# Patient Record
Sex: Female | Born: 1955 | Race: White | Hispanic: No | Smoking: Current every day smoker
Health system: Southern US, Community
[De-identification: ages and names within clinical notes are randomized; demographics above are authoritative.]

## PROBLEM LIST (undated history)

## (undated) DIAGNOSIS — B1920 Unspecified viral hepatitis C without hepatic coma: Secondary | ICD-10-CM

## (undated) DIAGNOSIS — G43909 Migraine, unspecified, not intractable, without status migrainosus: Secondary | ICD-10-CM

## (undated) DIAGNOSIS — I10 Essential (primary) hypertension: Secondary | ICD-10-CM

---

## 1999-09-25 ENCOUNTER — Emergency Department (HOSPITAL_COMMUNITY): Admission: EM | Admit: 1999-09-25 | Discharge: 1999-09-25 | Payer: Self-pay | Admitting: Emergency Medicine

## 1999-12-16 ENCOUNTER — Encounter: Payer: Self-pay | Admitting: Family Medicine

## 1999-12-16 ENCOUNTER — Ambulatory Visit (HOSPITAL_COMMUNITY): Admission: RE | Admit: 1999-12-16 | Discharge: 1999-12-16 | Payer: Self-pay | Admitting: Family Medicine

## 1999-12-20 ENCOUNTER — Ambulatory Visit (HOSPITAL_COMMUNITY): Admission: RE | Admit: 1999-12-20 | Discharge: 1999-12-20 | Payer: Self-pay | Admitting: Family Medicine

## 1999-12-20 ENCOUNTER — Encounter: Payer: Self-pay | Admitting: Family Medicine

## 2000-11-14 ENCOUNTER — Emergency Department (HOSPITAL_COMMUNITY): Admission: EM | Admit: 2000-11-14 | Discharge: 2000-11-14 | Payer: Self-pay | Admitting: Emergency Medicine

## 2000-11-23 ENCOUNTER — Encounter: Payer: Self-pay | Admitting: Emergency Medicine

## 2000-11-23 ENCOUNTER — Emergency Department (HOSPITAL_COMMUNITY): Admission: EM | Admit: 2000-11-23 | Discharge: 2000-11-23 | Payer: Self-pay | Admitting: Emergency Medicine

## 2001-02-13 ENCOUNTER — Encounter: Payer: Self-pay | Admitting: *Deleted

## 2001-02-13 ENCOUNTER — Ambulatory Visit (HOSPITAL_COMMUNITY): Admission: RE | Admit: 2001-02-13 | Discharge: 2001-02-13 | Payer: Self-pay | Admitting: *Deleted

## 2002-07-08 ENCOUNTER — Emergency Department (HOSPITAL_COMMUNITY): Admission: EM | Admit: 2002-07-08 | Discharge: 2002-07-08 | Payer: Self-pay | Admitting: *Deleted

## 2002-11-18 ENCOUNTER — Emergency Department (HOSPITAL_COMMUNITY): Admission: EM | Admit: 2002-11-18 | Discharge: 2002-11-18 | Payer: Self-pay | Admitting: Emergency Medicine

## 2002-11-18 ENCOUNTER — Encounter: Payer: Self-pay | Admitting: Emergency Medicine

## 2002-12-17 ENCOUNTER — Inpatient Hospital Stay (HOSPITAL_COMMUNITY): Admission: AD | Admit: 2002-12-17 | Discharge: 2002-12-18 | Payer: Self-pay | Admitting: Neurology

## 2002-12-17 ENCOUNTER — Encounter: Payer: Self-pay | Admitting: Emergency Medicine

## 2002-12-17 ENCOUNTER — Encounter: Payer: Self-pay | Admitting: Neurology

## 2002-12-18 ENCOUNTER — Encounter: Payer: Self-pay | Admitting: Cardiovascular Disease

## 2002-12-18 ENCOUNTER — Encounter: Payer: Self-pay | Admitting: Neurology

## 2003-01-23 ENCOUNTER — Ambulatory Visit (HOSPITAL_COMMUNITY): Admission: RE | Admit: 2003-01-23 | Discharge: 2003-01-23 | Payer: Self-pay | Admitting: Family Medicine

## 2003-01-23 ENCOUNTER — Encounter: Payer: Self-pay | Admitting: Family Medicine

## 2003-02-26 ENCOUNTER — Encounter: Payer: Self-pay | Admitting: Emergency Medicine

## 2003-02-26 ENCOUNTER — Emergency Department (HOSPITAL_COMMUNITY): Admission: EM | Admit: 2003-02-26 | Discharge: 2003-02-26 | Payer: Self-pay | Admitting: Emergency Medicine

## 2003-03-14 ENCOUNTER — Encounter: Payer: Self-pay | Admitting: *Deleted

## 2003-03-14 ENCOUNTER — Ambulatory Visit (HOSPITAL_COMMUNITY): Admission: RE | Admit: 2003-03-14 | Discharge: 2003-03-14 | Payer: Self-pay | Admitting: *Deleted

## 2003-03-23 ENCOUNTER — Ambulatory Visit (HOSPITAL_COMMUNITY): Admission: RE | Admit: 2003-03-23 | Discharge: 2003-03-23 | Payer: Self-pay | Admitting: *Deleted

## 2003-03-23 ENCOUNTER — Encounter: Payer: Self-pay | Admitting: *Deleted

## 2003-04-02 ENCOUNTER — Emergency Department (HOSPITAL_COMMUNITY): Admission: EM | Admit: 2003-04-02 | Discharge: 2003-04-02 | Payer: Self-pay | Admitting: Emergency Medicine

## 2003-04-22 ENCOUNTER — Ambulatory Visit (HOSPITAL_COMMUNITY): Admission: RE | Admit: 2003-04-22 | Discharge: 2003-04-22 | Payer: Self-pay | Admitting: Internal Medicine

## 2003-04-22 ENCOUNTER — Encounter: Payer: Self-pay | Admitting: Internal Medicine

## 2003-04-22 ENCOUNTER — Encounter (INDEPENDENT_AMBULATORY_CARE_PROVIDER_SITE_OTHER): Payer: Self-pay

## 2003-12-19 ENCOUNTER — Encounter: Admission: RE | Admit: 2003-12-19 | Discharge: 2003-12-19 | Payer: Self-pay | Admitting: Internal Medicine

## 2004-03-11 ENCOUNTER — Emergency Department (HOSPITAL_COMMUNITY): Admission: EM | Admit: 2004-03-11 | Discharge: 2004-03-11 | Payer: Self-pay | Admitting: Family Medicine

## 2004-03-16 ENCOUNTER — Encounter: Admission: RE | Admit: 2004-03-16 | Discharge: 2004-03-16 | Payer: Self-pay | Admitting: Internal Medicine

## 2004-03-23 ENCOUNTER — Encounter: Admission: RE | Admit: 2004-03-23 | Discharge: 2004-03-23 | Payer: Self-pay | Admitting: Internal Medicine

## 2004-03-24 ENCOUNTER — Encounter: Admission: RE | Admit: 2004-03-24 | Discharge: 2004-03-24 | Payer: Self-pay | Admitting: Internal Medicine

## 2005-07-27 ENCOUNTER — Ambulatory Visit: Payer: Self-pay | Admitting: Internal Medicine

## 2005-11-29 ENCOUNTER — Ambulatory Visit: Payer: Self-pay | Admitting: Internal Medicine

## 2005-12-05 ENCOUNTER — Emergency Department (HOSPITAL_COMMUNITY): Admission: EM | Admit: 2005-12-05 | Discharge: 2005-12-05 | Payer: Self-pay | Admitting: Emergency Medicine

## 2005-12-07 ENCOUNTER — Ambulatory Visit: Payer: Self-pay | Admitting: Internal Medicine

## 2005-12-12 ENCOUNTER — Ambulatory Visit: Payer: Self-pay | Admitting: Internal Medicine

## 2006-09-29 ENCOUNTER — Ambulatory Visit: Payer: Self-pay | Admitting: Internal Medicine

## 2006-10-04 ENCOUNTER — Encounter: Admission: RE | Admit: 2006-10-04 | Discharge: 2006-10-04 | Payer: Self-pay | Admitting: Internal Medicine

## 2006-11-08 ENCOUNTER — Encounter (INDEPENDENT_AMBULATORY_CARE_PROVIDER_SITE_OTHER): Payer: Self-pay | Admitting: Internal Medicine

## 2006-11-08 ENCOUNTER — Ambulatory Visit: Payer: Self-pay | Admitting: Internal Medicine

## 2006-11-08 DIAGNOSIS — IMO0002 Reserved for concepts with insufficient information to code with codable children: Secondary | ICD-10-CM

## 2006-11-08 DIAGNOSIS — E785 Hyperlipidemia, unspecified: Secondary | ICD-10-CM | POA: Insufficient documentation

## 2006-11-08 DIAGNOSIS — N63 Unspecified lump in unspecified breast: Secondary | ICD-10-CM | POA: Insufficient documentation

## 2006-11-08 DIAGNOSIS — B360 Pityriasis versicolor: Secondary | ICD-10-CM

## 2006-11-08 DIAGNOSIS — F17200 Nicotine dependence, unspecified, uncomplicated: Secondary | ICD-10-CM

## 2006-11-08 DIAGNOSIS — B1710 Acute hepatitis C without hepatic coma: Secondary | ICD-10-CM

## 2006-11-08 DIAGNOSIS — F1921 Other psychoactive substance dependence, in remission: Secondary | ICD-10-CM | POA: Insufficient documentation

## 2006-11-08 DIAGNOSIS — K219 Gastro-esophageal reflux disease without esophagitis: Secondary | ICD-10-CM | POA: Insufficient documentation

## 2006-11-08 DIAGNOSIS — A048 Other specified bacterial intestinal infections: Secondary | ICD-10-CM | POA: Insufficient documentation

## 2006-11-08 LAB — CONVERTED CEMR LAB
ALT: 13 U/L
AST: 14 U/L
Albumin: 4.3 g/dL
Alkaline Phosphatase: 85 U/L
BUN: 10 mg/dL
CO2: 19 meq/L
Calcium: 9.2 mg/dL
Chloride: 106 meq/L
Creatinine, Ser: 0.86 mg/dL
Glucose, Bld: 85 mg/dL
Hep B Core Total Ab: POSITIVE — AB
Hep B S Ab: NEGATIVE
Hepatitis B Surface Ag: NEGATIVE
Potassium: 4.3 meq/L
Sodium: 140 meq/L
Total Bilirubin: 0.4 mg/dL
Total Protein: 7.2 g/dL

## 2006-12-01 ENCOUNTER — Ambulatory Visit: Payer: Self-pay | Admitting: Internal Medicine

## 2006-12-01 DIAGNOSIS — Z8619 Personal history of other infectious and parasitic diseases: Secondary | ICD-10-CM

## 2007-01-06 ENCOUNTER — Observation Stay (HOSPITAL_COMMUNITY): Admission: EM | Admit: 2007-01-06 | Discharge: 2007-01-07 | Payer: Self-pay | Admitting: Family Medicine

## 2007-01-08 ENCOUNTER — Telehealth (INDEPENDENT_AMBULATORY_CARE_PROVIDER_SITE_OTHER): Payer: Self-pay | Admitting: *Deleted

## 2007-01-09 ENCOUNTER — Encounter (INDEPENDENT_AMBULATORY_CARE_PROVIDER_SITE_OTHER): Payer: Self-pay | Admitting: Internal Medicine

## 2007-01-10 ENCOUNTER — Encounter (INDEPENDENT_AMBULATORY_CARE_PROVIDER_SITE_OTHER): Payer: Self-pay | Admitting: *Deleted

## 2007-01-10 ENCOUNTER — Encounter (INDEPENDENT_AMBULATORY_CARE_PROVIDER_SITE_OTHER): Payer: Self-pay | Admitting: Internal Medicine

## 2008-03-05 ENCOUNTER — Emergency Department (HOSPITAL_COMMUNITY): Admission: EM | Admit: 2008-03-05 | Discharge: 2008-03-05 | Payer: Self-pay | Admitting: Family Medicine

## 2008-06-16 ENCOUNTER — Encounter: Admission: RE | Admit: 2008-06-16 | Discharge: 2008-06-16 | Payer: Self-pay | Admitting: Internal Medicine

## 2009-03-17 ENCOUNTER — Emergency Department (HOSPITAL_COMMUNITY): Admission: EM | Admit: 2009-03-17 | Discharge: 2009-03-17 | Payer: Self-pay | Admitting: Emergency Medicine

## 2009-04-27 ENCOUNTER — Encounter: Admission: RE | Admit: 2009-04-27 | Discharge: 2009-04-27 | Payer: Self-pay | Admitting: Family Medicine

## 2009-05-11 ENCOUNTER — Encounter: Admission: RE | Admit: 2009-05-11 | Discharge: 2009-05-11 | Payer: Self-pay | Admitting: Family Medicine

## 2009-06-17 ENCOUNTER — Ambulatory Visit (HOSPITAL_COMMUNITY): Admission: RE | Admit: 2009-06-17 | Discharge: 2009-06-18 | Payer: Self-pay | Admitting: Orthopaedic Surgery

## 2011-01-07 LAB — DIFFERENTIAL
Lymphocytes Relative: 28 % (ref 12–46)
Lymphs Abs: 1.9 10*3/uL (ref 0.7–4.0)
Neutro Abs: 3.8 10*3/uL (ref 1.7–7.7)
Neutrophils Relative %: 57 % (ref 43–77)

## 2011-01-07 LAB — COMPREHENSIVE METABOLIC PANEL
AST: 23 U/L (ref 0–37)
Alkaline Phosphatase: 70 U/L (ref 39–117)
BUN: 10 mg/dL (ref 6–23)
GFR calc Af Amer: >60 mL/min (ref >60)
Glucose, Bld: 85 mg/dL (ref 70–99)
Sodium: 139 mEq/L (ref 135–145)

## 2011-01-07 LAB — CBC
HCT: 45.9 % (ref 36.0–46.0)
Hemoglobin: 15.8 g/dL — ABNORMAL HIGH (ref 12.0–15.0)
MCHC: 34.4 g/dL (ref 30.0–36.0)
Platelets: 270 10*3/uL (ref 150–400)
RBC: 4.92 MIL/uL (ref 3.87–5.11)
RDW: 14.3 % (ref 11.5–15.5)

## 2011-01-07 LAB — BILIRUBIN, DIRECT: Bilirubin, Direct: 0.1 mg/dL (ref 0.0–0.3)

## 2011-01-07 LAB — APTT: aPTT: 24 seconds (ref 24–37)

## 2011-01-07 LAB — PROTIME-INR: Prothrombin Time: 12.6 seconds (ref 11.6–15.2)

## 2011-01-10 LAB — URINALYSIS, ROUTINE W REFLEX MICROSCOPIC
Nitrite: NEGATIVE
Specific Gravity, Urine: 1.014 (ref 1.005–1.030)
pH: 5.5 (ref 5.0–8.0)

## 2011-01-10 LAB — DIFFERENTIAL
Basophils Absolute: 0 10*3/uL (ref 0.0–0.1)
Eosinophils Absolute: 0 10*3/uL (ref 0.0–0.7)
Lymphocytes Relative: 16 % (ref 12–46)
Monocytes Absolute: 0.9 10*3/uL (ref 0.1–1.0)
Monocytes Relative: 9 % (ref 3–12)
Neutro Abs: 7.6 10*3/uL (ref 1.7–7.7)
Neutrophils Relative %: 75 % (ref 43–77)

## 2011-01-10 LAB — COMPREHENSIVE METABOLIC PANEL
AST: 23 U/L (ref 0–37)
Alkaline Phosphatase: 71 U/L (ref 39–117)
BUN: 14 mg/dL (ref 6–23)
Chloride: 106 mEq/L (ref 96–112)
Glucose, Bld: 150 mg/dL — ABNORMAL HIGH (ref 70–99)
Potassium: 3.9 mEq/L (ref 3.5–5.1)
Sodium: 135 mEq/L (ref 135–145)
Total Bilirubin: 0.8 mg/dL (ref 0.3–1.2)

## 2011-01-10 LAB — CBC
HCT: 46.8 % — ABNORMAL HIGH (ref 36.0–46.0)
Hemoglobin: 15.9 g/dL — ABNORMAL HIGH (ref 12.0–15.0)
MCV: 92.2 fL (ref 78.0–100.0)
Platelets: 243 10*3/uL (ref 150–400)
WBC: 10.1 10*3/uL (ref 4.0–10.5)

## 2011-01-10 LAB — LIPASE, BLOOD: Lipase: 21 U/L (ref 11–59)

## 2011-02-18 NOTE — Discharge Summary (Signed)
Sheri Burnett, Sheri Burnett        ACCOUNT NO.:  [id number]   MEDICAL RECORD NO.:  [id number]          PATIENT TYPE:  INP   LOCATION:  3711                         FACILITY:  [facility]   PHYSICIAN:  C. Ulyess Mort, M.D.DATE OF BIRTH:  1956/07/16   DATE OF ADMISSION:  01/06/2007  DATE OF DISCHARGE:  01/07/2007                               DISCHARGE SUMMARY   DISCHARGE DIAGNOSES:  1. Chest pain, likely secondary to gastrointestinal etiology or      costochondritis.  2. Gastroesophageal reflux with a history of H. pylori, currently H.      pylori-negative.  3. Hepatitis C.  4. 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  5. Tobacco abuse.  6. Hyperlipidemia.   DISCHARGE MEDICATIONS:  1. Over-the-counter cough medicine.  2. She was offered Protonix 40 mg p.o. twice a day or famotidine 40 mg      p.o. twice a day, however she did not take the prescriptions when      she was discharged.   DISPOSITION AND FOLLOWUP:  Sheri Burnett had mild resolution of her chest  pain and had negative troponins over 24 hours and had a negative workup  for PE and pneumothorax.  The outpatient clinic will contact her with a  followup appointment, as she was discharged over the weekend.  At the  time of her followup appointment, it should be asked whether she  continues to have chest pain and if she does whether it is related with  food and if she has tried an over-the-counter antacid or proton pump  inhibitor such as Prilosec in an attempt to relieve her pain and if not,  she can be started on a trial of Protonix 40 once a day and then it  could be determined if she needs additional workup potentially of peptic  ulcer disease or worsening of her GERD.  She should also be counseled to  quit smoking.  Please note that she left before being officially  discharged by the nurses, though the order was already in and she had  signed the discharge summary and instructions.  However, before the  nurses were able to remove her IV, she actually removed her IV herself  and left the hospital without indicating she was leaving to anybody.   PROCEDURES PERFORMED DURING THIS HOSPITALIZATION:  Included a CT of the  chest with contrast, this was performed on January 06, 2007 and the finding  were that she had no embolus, but she did have a moderate hiatal hernia,  paraseptal and centrilobular emphysema, mediastinal and right hilar  lymph nodes, though they were not pathologic and a soft tissue density  along the superior mediastinum at the thoracic inlet which was believed  to probably represent a thyroid goiter.   No consultations were obtained during this hospitalization.   BRIEF HISTORY AND PHYSICAL:  Sheri Burnett is a 55 year old female who  had a two-week history of dry cough and then developed a subjective  fever and chest pain that felt like a gas bubble and heartburn that was  not helped by antacid and the pain was worse with cough and very  deep  breath, nothing made it better and on the morning of admission, her  chest pain was 6/10.  She also started developing pressure under her  sternum.  She went to an urgent care center and her pain was relieved  with aspirin and sublingual nitroglycerin.  She also complained of  chills, nausea and vomiting once and diaphoresis.   VITAL SIGNS:  Temperature 97.2, blood pressure 140/86, pulse 86,  respiratory rate 20, O2 sat 95% on room air.  GENERAL:  She was sitting upright in a stretcher in no apparent  distress.  NECK:  No JVD.  No thyromegaly.  LUNGS:  Clear to auscultation bilaterally with good air movement.  CARDIOVASCULAR EXAM:  S1-S2.  No murmur.  Regular rate and rhythm.  ABDOMEN:  Bowel sounds are positive.  No tenderness, rebound or  guarding.  EXTREMITIES:  Had no edema, 2+ pedal pulses.  NEURO:  She was alert and oriented x3.  Cranial nerves II-XII intact and  she smelled heavily of cigarette smoke.   She had had a serum H.  pylori screen that was negative, two sets of  negative troponins, a D-dimer of 1.35.   Sodium 137, potassium 3.9, chloride 104, bicarb 25, BUN 9, creatinine  0.9, glucose 135, white blood cell count 15.8, hematocrit 47, white  blood cell count 46, platelets 243.   For more details of history and physical, please refer to the chart.   PROBLEM:  1. Chest pain.  With her history we were concerned that it was      potentially an MI, acute coronary syndrome or PE so we ruled out      these three possible diagnoses.  Concerning the MI potential, we      obtained an EKG.  Her EKGs were negative.  We cycled her troponins      over 24 hours and they were all negative and while she did have      relief secondary to having sublingual nitroglycerin, she had more      relief  when she was given a GI cocktail so it is very unlikely      that this was a cardiac etiology.  She also was shown to have a      hiatal hernia on the CT scan.  Concerning PE, we found that she had      an elevated D-dimer of 1.35.  She denies tachycardia, but she has a      history of smoking and she leads mostly a sedentary lifestyle, so a      CT scan was obtained to rule out PE and CT scan consequently showed      no signs of embolus.  A chest x-ray was clear making an upper      respiratory-type infection very unlikely.  We are still thinking      that this was either GERD or this was musculoskeletal pain      secondary to her 12 days of chronic coughing.  We gave her a GI      cocktail, Protonix b.i.d. and offered Maalox p.r.n. for pain, but      she declined the Maalox and she declined Tylenol and any narcotic      medications for pain.  Concerning the history of drug dependence,      it was decided that she could be discharged safely on Protonix or      famotidine with life-threatening causes of her chest pain ruled  out, however she decided to leave without these prescriptions and     will hopefully follow up at  the outpatient clinic.  2. Hyperlipidemia.  We found that her LDL was mildly elevated at 154.      With her risk stratification it can be determined whether she would      have a target below 130.  She does have some mild hyperglycemia,      but no standing diagnosis of diabetes.  Would likely benefit from      diet control, as she has an HDL of 30 as well.  This can be      determined on an outpatient basis.  3. Tobacco abuse.  While in the ER, it seems very likely that Ms.      Cottam was able to sneak out of the ER to go smoking before      coming back in, as she did not smell of cigarette smoke on the      initial encounter but smelled heavily of cigarette smoke on      reevaluation.  She was given a 21 mg patch of nicotine, but again,      she became more and more agitated during her hospital course when I      asked her if this was because she was not allowed to smoke, it      seems likely as she left maybe ten minutes before she would have      been discharged anyway pulling out her own IV to do so, but as far      as I could tell no specific reason to do otherwise than to smoke a      little sooner than she would have otherwise been able to.  Mr.      Beckmann showed absolutely no interest in smoking cessation and      this should be revisited in the future.  4. GERD:  This again thought most likely to be the underlying reason      of her pain if not secondary to her dry cough.  She showed no signs      of bacterial infection.  She does have a hiatal hernia shown on CT      scan.  When she returns to clinic it can be determined if she needs      to be started on Protonix or another medication to alleviate her      GERD.   DISCHARGE LABS AND VITALS:  Temperature was 98.2, pulse 66, respiration  was 20, blood pressure 121/71, O2 sat 95% on room air.  She weighed 87  kilograms.   Last set of labs showed a TSH of 2.72, a B-Met with a sodium of 139,  potassium 3.9, chloride 110,  bicarb 22, glucose 120, BUN 9, creatinine  0.81, calcium of 8.5, white blood cell count of 7.4, hemoglobin 14.3,  hematocrit 41.6, platelets 233.      Valetta Close, M.D.  Electronically Signed      C. Ulyess Mort, M.D.  Electronically Signed    JC/MEDQ  D:  01/09/2007  T:  01/09/2007  Job:  [id number]   cc:   Chauncey Reading, D.O.

## 2011-02-18 NOTE — H&P (Signed)
NAMESHALON, SALADO                        ACCOUNT NO.:  [id number]   MEDICAL RECORD NO.:  [id number]                   PATIENT TYPE:  INP   LOCATION:  NA                                   FACILITY:  [facility]   PHYSICIAN:  Santina Evans A. Orlin Hilding, M.D.          DATE OF BIRTH:  [birth year]   DATE OF ADMISSION:  12/17/2002  DATE OF DISCHARGE:                                HISTORY & PHYSICAL   CHIEF COMPLAINT:  Left-sided weakness and numbness.   HISTORY OF PRESENT ILLNESS:  The patient is a 55 year old, right-handed  white woman with no significant past medical history but no regular care.  She had sudden onset at around 11 o'clock this morning of left-sided  tingling of her face, arm, and leg while she was at school typing on the  computer.  There was associated left-sided posterior headache and mild  weakness.  She denies any vision changes or incoordination. She drove  herself home and then had a friend drop her off in the emergency room.  Her  symptoms are persisting. She is now six hours out.   REVIEW OF SYSTEMS:  Positive for mild shortness of breath.  No chest pain,  no fever or chills.  She does have a left-sided headache.   PAST MEDICAL HISTORY:  Questionable dyslipidemia, but she does not take  medicine.  She denies hypertension, diabetes, and coronary artery disease.   PAST SURGICAL HISTORY:  1. Remote tubal ligation.  2. Hysterectomy.  3. Tonsillectomy.   MEDICATIONS:  None routinely.   ALLERGIES:  No known drug allergies.   SOCIAL HISTORY:  She works in Clinical biochemist.  She is taking classes in  business school.  She is widowed, lives with a friend, has two children.  She smokes a pack of cigarettes a day, no alcohol, does drink a lot of  caffeine.   FAMILY HISTORY:  Positive for stroke.   PHYSICAL EXAMINATION:  VITAL SIGNS:  Temperature 97.0, blood pressure  131/80, pu9lse 78, respirations 18.  GENERAL:  She is keeping her eyes closed secondary to  photophobia from her  headache.  She looks mildly distressed.  HEENT:  Head is normocephalic and atraumatic.  NECK:  Supple without bruits.  HEART:  Regular rate and rhythm.  LUNGS:  Clear to auscultation.  ABDOMEN:  Positive bowel sounds.  Soft.  EXTREMITIES:  Without edema.  NEUROLOGIC:  Mental status: She is awake and alert and has normal naming,  comprehension, and repetition.  Cranial Nerves: Pupils equal and reactive.  Visual fields demonstrate a potential left visual field cut.  I could not  see her fundi well.  Extraocular movements are intact without nystagmus,  ophthalmoparesis, or ptosis.  Facial sensation: She describes decreased  sensation or abnormal sensation with tingling in her left face, all three  divisions.  Facial motor activity is intact without weakness, droop, or  asymmetry.  Hearing is intact.  Palate is symmetric, and  tongue is midline.  She has no dysarthria.  She has normal shoulder shrug.  Motor exam: There is  no drift, no satelliting.  She has normal bulk, tone, and strength  throughout on the right.  On the left, there is either uniformly decreased  effort throughout all muscle groups versus true weakness at 4+ to 5-/5 on  the whole left side.  Her gait is normal.  Reflexes are trace diffusely with  downgoing toes.  Coordination:  Finger-to-nose, heel-to-shin, rapid  alternating movements are normal, but everything is sluggishly performed.  Sensory exam:  She reports dysesthesia to light touch on the left.   LABORATORY DATA:  CT scan of the brain is essentially normal except for left  maxillary and ethmoid sinusitis with small air/fluid level.   Sodium 137, potassium 3.9, chloride 108, CO2 27, BUN 10, creatinine 1.0,  calcium 8.8, glucose 99.  White blood cell count 6.1, hemoglobin 14.9,  hematocrit 43.3, platelets 209.  PT 12.5, INR 0.9, PTT 27.   IMPRESSION:  1. Left-sided weakness, numbness, and field cut with some effort-related     findings.   Must rule out a right brain stroke.  Risk factors: Cigarettes.     She is out of the window and has very minimal findings, does not qualify     for TPA.  2. Sinusitis.   PLAN:  Admit to [facility] to rule out right brain stroke.  Will  get an MRI of the brain in a.m., angiogram, carotid Doppler, 2-D  echocardiogram.  Start her on aspirin daily.  Will start her on Augmentin  for the sinusitis.                                               Catherine A. Orlin Hilding, M.D.    CAW/MEDQ  D:  12/17/2002  T:  12/17/2002  Job:  [id number]

## 2011-04-14 ENCOUNTER — Other Ambulatory Visit: Payer: Self-pay

## 2011-06-30 LAB — POCT I-STAT, CHEM 8
Calcium, Ion: 1.11 — ABNORMAL LOW
Glucose, Bld: 91
HCT: 49 — ABNORMAL HIGH
Hemoglobin: 16.7 — ABNORMAL HIGH

## 2011-07-14 ENCOUNTER — Other Ambulatory Visit: Payer: Self-pay | Admitting: Gastroenterology

## 2011-07-14 ENCOUNTER — Ambulatory Visit (INDEPENDENT_AMBULATORY_CARE_PROVIDER_SITE_OTHER): Payer: BC Managed Care – PPO | Admitting: Gastroenterology

## 2011-07-14 DIAGNOSIS — B182 Chronic viral hepatitis C: Secondary | ICD-10-CM

## 2011-07-14 LAB — CBC WITH DIFFERENTIAL/PLATELET
Basophils Relative: 1 % (ref 0–1)
Hemoglobin: 17.5 g/dL — ABNORMAL HIGH (ref 13.0–17.0)
Lymphocytes Relative: 29 % (ref 12–46)
MCHC: 34.7 g/dL (ref 30.0–36.0)
Monocytes Relative: 11 % (ref 3–12)
Neutro Abs: 3.8 10*3/uL (ref 1.7–7.7)
Neutrophils Relative %: 58 % (ref 43–77)
RBC: 5.35 MIL/uL (ref 4.22–5.81)
WBC: 6.5 10*3/uL (ref 4.0–10.5)

## 2011-07-14 LAB — COMPREHENSIVE METABOLIC PANEL
AST: 59 U/L — ABNORMAL HIGH (ref 0–37)
Albumin: 4.2 g/dL (ref 3.5–5.2)
Alkaline Phosphatase: 90 U/L (ref 39–117)
BUN: 14 mg/dL (ref 6–23)
Creat: 0.82 mg/dL (ref 0.50–1.35)
Potassium: 4.4 mEq/L (ref 3.5–5.3)

## 2011-07-14 LAB — IRON AND TIBC
%SAT: 45 % (ref 20–55)
TIBC: 320 ug/dL (ref 215–435)

## 2011-07-14 LAB — PROTIME-INR: Prothrombin Time: 12.3 seconds (ref 11.6–15.2)

## 2011-07-14 LAB — SEDIMENTATION RATE: Sed Rate: 1 mm/hr (ref 0–16)

## 2011-07-15 LAB — HEPATITIS B SURFACE ANTIBODY,QUALITATIVE: Hep B S Ab: NEGATIVE

## 2011-07-15 LAB — AFP TUMOR MARKER: AFP-Tumor Marker: 4.1 ng/mL (ref 0.0–8.0)

## 2011-07-15 LAB — HEPATITIS A ANTIBODY, TOTAL: Hep A Total Ab: POSITIVE — AB

## 2011-07-18 LAB — INTERLEUKIN 28B POLYMORPHISM GENOTYPE, RT-PCR

## 2011-07-22 ENCOUNTER — Other Ambulatory Visit: Payer: Self-pay | Admitting: Gastroenterology

## 2011-07-22 DIAGNOSIS — B182 Chronic viral hepatitis C: Secondary | ICD-10-CM

## 2011-07-22 NOTE — Progress Notes (Addendum)
NAMEMarland Kitchen  Sheri Burnett, Sheri Burnett  MR#:  [id number]      DATE:  07/14/2011  DOB:  1955/12/02    cc: Primary Care Physician:  Lillia Carmel, MD, [address], [address] [place], [place] [address], Fax [phone]  Referring Physician:  Lillia Carmel, MD, [facility], [address] [place], [place] [address], Fax [phone]    REASON FOR REFERRAL:  Presumably genotype 1 hepatitis C.   History:  The patient is a 55 year old [place] woman who I have been asked to see in consultation by Dr. Prince Rome regarding presumably genotype 1 hepatitis C.  According to the patient, she was first diagnosed approximately 10 years ago based on risk factors, i.e. drug use. She recalls approximately 8 years ago seeing somebody, the name of which she cannot recall, possibly Infectious Disease in [place]. A biopsy on April 22, 2003, showed grade 2 stage I disease. She was told that her liver disease was not significant enough to warrant therapy at the  time. I gather she was followed expectantly by her primary physician, Dr. Oleta Mouse. What prompted today's consultation are her concerns about possible sexual transmission of hepatitis C. In addition, the notes  indicate that she is total hepatitis B core antibody positive but the patient has been led to believe that she could be infectious from hepatitis B, and was concerned about this, as well.  Currently, there are no symptoms referable to her history of hepatitis C. There are no symptoms to suggest cryoglobulin mediated or decompensated liver disease.  With respect to risk factors for liver disease, she admitted to significant alcohol abuse prior to 10 years ago when she stopped drinking altogether. There is also a history of intravenous drug use  until 10 years ago. There is history of tattoos, but no history of blood transfusions. She is unsure as to whether her ears were pierced in a sterile fashion. Her family history is negative  for liver  disease, other than the potential of possible that her paternal grandfather had liver cancer, but she is not sure of the details. She has not been vaccinated against hepatitis A or B that she knows.   PAST MEDICAL HISTORY:  Hypertension and possibly dyslipidemia. She also reports arthralgias. Denies any history of diabetes, or coronary artery disease, dysthyroidism.   PAST SURGICAL HISTORY:  Back surgery, tubal ligation, hysterectomy, tonsillectomy, and adenectomy.    Past psychiatric history:  Denies.   CURRENT MEDICATIONS:  Lisinopril 10 mg p.o. daily, zolpidem at 10 mg p.o. at bedtime, venlafaxine 7.5 mg p.o. b.i.d.    Allergies:  Denies.    Habits:  Smoking 1 pack cigarettes per day. Alcohol as above.   FAMILY HISTORY:  As above.   SOCIAL HISTORY:  Divorced. She has 2 children. She is accompanied by a friend today who is a retired Administrator, arts. 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   REVIEW OF SYSTEMS:  All 10 systems reviewed today with the patient on the review of systems form, which was signed and placed in the chart. CES-D was 5.   PHYSICAL EXAMINATION:   Constitutional:  Well-appearing without stigmata of chronic liver disease or significant peripheral wasting. Vital signs: Height 66 inches, weight 200 pounds, blood pressure 119/86, pulse of 72,  temperature is 96.4 Fahrenheit. Ears, nose, mouth and throat:  Unremarkable oropharynx.  No thyromegaly or neck masses.  Chest:  Resonant to percussion.  Clear to  auscultation.  Cardiovascular:   Heart sounds normal S1, S2 without murmurs or rubs.  There is no peripheral edema.  Abdominal:  Normal bowel sounds.  No masses or tenderness.  I could not appreciate a liver edge or spleen tip.  I  could not appreciate any hernias.  Lymphatics:  No cervical or inguinal lymphadenopathy.  Central Nervous System:  No asterixis or focal neurologic  findings.  Dermatologic:  Anicteric without palmar  erythema or spider angiomata.  Eyes:  Anicteric sclerae.  Pupils are equal and reactive to light.   Laboratories:  Previous lab work 12/09/2010, her ALT was 56, AST 51, ALP 83, total bilirubin 0.5, albumin 4.2, globulins 2.58. Hepatitis B surface antigen was negative. Total B core antibody was positive, hepatitis B  surface antibody was negative, and hepatitis B E antigen was negative.  On 06/10/2010, her CBC was notable for hemoglobin 16.2, MCV 94.9, white count 6.1, platelets 254.  Creatinine was 0.92, AST was 18, ALT 19, ALP 76, total bilirubin 0.6, albumin 4.0, globulins 2.2.  Qualitative HCV RNA was positive and her AFP was 2.3.  Her total B core antibody was positive on that occasion, as well.    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 Her IL 28 b status is  unknown.   In terms her hepatitis C, I do not see any contraindication to treating her for hepatitis C. Biopsy could be done at this time to assess the degree of fibrosis and determine the timing of initiation  of treatment, i.e., whether she needs to be treated earlier if she has more fibrosis or  wait for the availability of perhaps simeprevir if she does not have advanced fibrosis.  In terms of her hepatitis B, as mentioned in the notes, it should be noted that she was probably exposed to hepatitis B and is now hepatitis B immune. She is not contagious and this issue should be  considered closed. She would not need further monitoring of her hepatitis B, although to be sure in very significant immunosuppression total B core antibody patients have rarely  been known to a flare.  In my discussion today with the patient, we discussed the significance of genotype 1 hepatitis C based and her previous biopsy findings. We discussed  treatment with pegylated interferon and ribavirin and a  protease inhibitor. I discussed our treatment protocol for clinic, as well as the response rates. I discussed the specific system, constitutional, and psychiatric side effects of therapy, as well. We  also discussed the risk of contagion. Having discussed all this the patient would like more information on whether she should go ahead with treatment or not and agrees to a liver biopsy. I have explained  how this is done including the risks that are but not limited to pain, bleeding, and perforation.  I also explained the significance of her hepatitis serologies and explained to her that she is not considered contagious in that respect.  I then discussed the possibility of participating in clinical trials should she have stable genotype. I have explained that these trials are experimental so that the toxicities and efficacy of these drugs  are not entirely known. I have explained that she will have to be prepared to travel to [facility] for several visits. She was interested in  hearing more from the coordinators about trials should  she be eligible.   Plan:  1. Standard labs. 2. Check a genotype. 3. Test for hepatitis A immunity. 4. Hepatitis B is likely immune based on the exposure history and is total B core antibody positive with likely a low titer of hepatitis B surface antibody. 5. Book for liver biopsy. 6. The patient would like to be contacted with the results of the biopsy to give her an opportunity to decide whether she wants to be treated. 7. I will pass her name along to the research coordinators should she be eligible. 8. I have recommended a number of web sites, which she is already familiar with for information about hepatitis C. 9. I will check an IL 28 B and genotype to see if we can get a genotype 1 subtype.            Brooke Dare, MD    ADDENDUM  Hepatitis A immune.   Genotype not done as ordered.  Will have to have her come  back to get this  done.  IL28 B CC.  ADDENDUM 08/04/11  Genotype 1a.  403 .[id number]  D:  Thu Oct 11 16:22:56 2012 ; T:  Thu Oct 11 17:18:19 2012  Job #:  [id number]

## 2011-08-02 LAB — HEPATITIS C GENOTYPE

## 2011-08-04 ENCOUNTER — Ambulatory Visit (HOSPITAL_COMMUNITY)
Admission: RE | Admit: 2011-08-04 | Discharge: 2011-08-04 | Disposition: A | Discharge status: Home / Self Care | Payer: BC Managed Care – PPO | Source: Ambulatory Visit | Attending: Gastroenterology | Admitting: Gastroenterology

## 2011-08-04 ENCOUNTER — Other Ambulatory Visit: Payer: Self-pay | Admitting: Diagnostic Radiology

## 2011-08-04 DIAGNOSIS — B182 Chronic viral hepatitis C: Secondary | ICD-10-CM | POA: Insufficient documentation

## 2011-08-04 LAB — CBC
HCT: 46.3 % — ABNORMAL HIGH (ref 36.0–46.0)
MCH: 32.7 pg (ref 26.0–34.0)
MCHC: 35 g/dL (ref 30.0–36.0)
MCV: 93.3 fL (ref 78.0–100.0)
RDW: 13.3 % (ref 11.5–15.5)

## 2011-08-04 LAB — APTT: aPTT: 27 seconds (ref 24–37)

## 2011-08-11 ENCOUNTER — Encounter: Payer: Self-pay | Admitting: Gastroenterology

## 2011-08-11 ENCOUNTER — Other Ambulatory Visit: Payer: Self-pay | Admitting: Gastroenterology

## 2012-02-16 ENCOUNTER — Ambulatory Visit: Payer: BC Managed Care – PPO | Admitting: Gastroenterology

## 2012-02-23 ENCOUNTER — Ambulatory Visit (INDEPENDENT_AMBULATORY_CARE_PROVIDER_SITE_OTHER): Payer: BC Managed Care – PPO | Admitting: Gastroenterology

## 2012-02-23 DIAGNOSIS — B182 Chronic viral hepatitis C: Secondary | ICD-10-CM

## 2012-03-01 NOTE — Progress Notes (Signed)
NAMEMarland Burnett  BIANCO, CANGE  MR#:  [id number]      DATE:  02/23/2012  DOB:  Mar 01, 1956    cc: Primary Care Physician: Same Referring Physician: Lavada Mesi, MD, [address], [address] [place], [place] [address], Fax [phone]    REASON FOR VISIT:  Follow up of genotype 1a hepatitis C.   HISTORY:  The patient returns today unaccompanied. She has no symptoms referable to her history of hepatitis C nor are there symptoms to suggest cryoglobulin mediated or decompensated liver disease.   PAST MEDICAL HISTORY:  There has been no new developments since last being seen.   CURRENT MEDICATIONS:  1. Lisinopril 10 mg daily.  2. Zolpidem 10 mg p.o. at bedtime.  3. Venlafaxine 7.5 mg b.i.d.   ALLERGIES:  Denies.   HABITS:  Smoking 1 pack of cigarettes per day. Alcohol denies interval consumption.   REVIEW OF SYSTEMS:  All 10 systems reviewed today with the patient and they are negative other than which is mentioned above. Her CES-D was 14.   PHYSICAL EXAMINATION:  Constitutional: Well appearing without significant peripheral wasting or stigmata of chronic liver disease. Vital signs: Height 66 inches, weight 209 pounds, blood pressure 120/79, pulse of 70, temperature 97.6 Fahrenheit.   LABORATORIES:  Previous labs were reviewed within [facility]. Liver biopsy on 08/09/2011, showed MHAI score 5/18 and fibrosis score 2/6, roughly translating on Ludwig-Batts scale for fibrosis of a 1/4. This is similar to a biopsy of 04/22/2003, showing grade 2 stage I disease.   ASSESSMENT:  The patient is a 56 year old woman with history of genotype 1a hepatitis C, naive to treatment, who had a liver biopsy on 04/22/2003, showing grade 2 stage I disease and another liver biopsy on 08/04/2011, showing effectively the same finding in terms of fibrosis - stage 1/4 fibrosis. I think given the lack of progression and the evolving nature of treatment of hepatitis C, there is no rush to put her on  therapy at this time. Rather one could consider sofosbuvir with interferon, ribavirin in the future.   Today, I discussed the results of liver biopsy in the context of previous lab results and biopsy. I have explained to her that there is no evidence of progression of disease. I explained that therefore be worth waiting for the availability of liver edge inhibitors in combination with interferon and ribavirin that may improve the response rate while shorten duration of therapy and improve the side effect profile treatment. She was very much in agreement to wait. We agreed to meet again in approximately January 2014 to discuss treatment paradigms at the time.   PLAN:   1. Hepatitis A immune.  2. Hepatitis B considered immune based on previous lab testing.  3. Return in January 2014 or thereabouts to discuss treatment at this time.  4. I have given her some literature on her liver biopsy results.               Brooke Dare, MD   [phone]  D:  Thu May 23 19:59:33 2013 ; TMorey Hummingbird May 23 23:55:22 2013  Job #:  [id number]

## 2013-02-15 ENCOUNTER — Encounter (HOSPITAL_COMMUNITY): Payer: Self-pay | Admitting: Emergency Medicine

## 2013-02-15 ENCOUNTER — Emergency Department (INDEPENDENT_AMBULATORY_CARE_PROVIDER_SITE_OTHER)
Admission: EM | Admit: 2013-02-15 | Discharge: 2013-02-15 | Disposition: A | Discharge status: Home / Self Care | Payer: Self-pay | Source: Home / Self Care | Attending: Family Medicine | Admitting: Family Medicine

## 2013-02-15 DIAGNOSIS — B029 Zoster without complications: Secondary | ICD-10-CM

## 2013-02-15 HISTORY — DX: Essential (primary) hypertension: I10

## 2013-02-15 MED ORDER — DICLOFENAC POTASSIUM 50 MG PO TABS: 50.0000 mg | ORAL_TABLET | Freq: Three times a day (TID) | ORAL | Status: DC

## 2013-02-15 MED ORDER — VALACYCLOVIR HCL 1 G PO TABS: 1000.0000 mg | ORAL_TABLET | Freq: Three times a day (TID) | ORAL | Status: DC

## 2013-02-15 NOTE — ED Provider Notes (Signed)
History     CSN: [id number]  Arrival date & time 02/15/13  1413   None     Chief Complaint  Patient presents with  . Herpes Zoster    (Consider location/radiation/quality/duration/timing/severity/associated sxs/prior treatment) Patient is a 57 y.o. female presenting with rash. The history is provided by the patient.  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   Past Medical History  Diagnosis Date  . Hypertension     History reviewed. No pertinent past surgical history.  No family history on file.  History  Substance Use Topics  . Smoking status: Not on file  . Smokeless tobacco: Not on file  . Alcohol Use: Not on file    OB History   Grav Para Term Preterm Abortions TAB SAB Ect Mult Living                  Review of Systems  Constitutional: Negative.   Musculoskeletal: Negative for joint swelling.  Skin: Negative for rash.    Allergies  Review of patient's allergies indicates no known allergies.  Home Medications   Current Outpatient Rx  Name  Route  Sig  Dispense  Refill  . diclofenac (CATAFLAM) 50 MG tablet   Oral   Take 1 tablet (50 mg total) by mouth 3 (three) times daily. Prn pain   21 tablet   0   . LISINOPRIL PO   Oral   Take by mouth.         . Lurasidone HCl (LATUDA PO)   Oral   Take by mouth.         . valACYclovir (VALTREX) 1000 MG tablet   Oral   Take 1 tablet (1,000 mg total) by mouth 3 (three) times daily.   21 tablet   0   . Venlafaxine HCl (EFFEXOR PO)   Oral   Take by mouth.           BP 115/75  Pulse 74  Temp(Src) 97.9 F (36.6 C) (Oral)  Resp 18  SpO2 97%  Physical Exam  Nursing note and vitals reviewed. Constitutional: She is oriented  to person, place, and time. She appears well-developed and well-nourished. No distress.  Neck: Normal range of motion. Neck supple.  Lymphadenopathy:    She has no cervical adenopathy.  Neurological: She is alert and oriented to person, place, and time.  Skin: Skin is warm and dry. No rash noted.  Psychiatric: She has a normal mood and affect.    ED Course  Procedures (including critical care time)  Labs Reviewed - No data to display No results found.   1. Shingles       MDM          Linna Hoff, MD 02/15/13 2050

## 2013-02-15 NOTE — ED Notes (Signed)
Pt c/o poss shingles outbreak onset 4 days; no rash that's visible Just hurts upper back and wraps around to left side; feels like a "sunburn"  She is alert and oriented w/no signs of acute distress.

## 2013-02-26 ENCOUNTER — Ambulatory Visit: Payer: No Typology Code available for payment source | Attending: Family Medicine | Admitting: Internal Medicine

## 2013-02-26 VITALS — BP 118/74 | HR 88 | Temp 98.1°F | Resp 17 | Wt 215.8 lb

## 2013-02-26 DIAGNOSIS — B0223 Postherpetic polyneuropathy: Secondary | ICD-10-CM

## 2013-02-26 DIAGNOSIS — K219 Gastro-esophageal reflux disease without esophagitis: Secondary | ICD-10-CM | POA: Insufficient documentation

## 2013-02-26 DIAGNOSIS — B029 Zoster without complications: Secondary | ICD-10-CM | POA: Insufficient documentation

## 2013-02-26 DIAGNOSIS — B1920 Unspecified viral hepatitis C without hepatic coma: Secondary | ICD-10-CM | POA: Insufficient documentation

## 2013-02-26 LAB — CBC WITH DIFFERENTIAL/PLATELET
Basophils Relative: 0 % (ref 0–1)
Eosinophils Absolute: 0.2 10*3/uL (ref 0.0–0.7)
MCH: 32.1 pg (ref 26.0–34.0)
MCHC: 34.8 g/dL (ref 30.0–36.0)
Neutrophils Relative %: 55 % (ref 43–77)
Platelets: 220 10*3/uL (ref 150–400)
RDW: 14.2 % (ref 11.5–15.5)

## 2013-02-26 MED ORDER — LISINOPRIL 10 MG PO TABS: 10.0000 mg | ORAL_TABLET | Freq: Every day | ORAL | Status: DC

## 2013-02-26 MED ORDER — ESOMEPRAZOLE MAGNESIUM 40 MG PO CPDR: 40.0000 mg | DELAYED_RELEASE_CAPSULE | Freq: Every day | ORAL | Status: DC

## 2013-02-26 MED ORDER — LISINOPRIL 10 MG PO TABS: 10.0000 mg | ORAL_TABLET | Freq: Every day | ORAL | Status: AC

## 2013-02-26 MED ORDER — GABAPENTIN 100 MG PO CAPS: 200.0000 mg | ORAL_CAPSULE | Freq: Three times a day (TID) | ORAL | Status: DC

## 2013-02-26 NOTE — Progress Notes (Signed)
  Subjective:    Patient ID: Sheri Burnett, female    DOB: [birth year], 57 y.o.   MRN: [id number]  HPI 57 year old female is here to establish care and has multiple complaints Recently diagnosed with shingles and was prescribed Valtrex by the ER, the patient has completed a total course of Valtrex, but is left with residual pain associated with shingles, and is requesting pain medicine for it. She also complains of acid reflux and wants to try a prescription medication and she has tried multiple over-the-counter medications  She also complains of skin lesions on her neck and on her knee which appeared to be benign moles and subcutaneous cyst. She is requesting a referral to see dermatology for this. She also history of hepatitis C and had a liver biopsy in 2004 but lost insurance and has not had a GI followup. She also needs a note to be with her back and exercise   Past medical history Herpes zoster Hypertension Depression Bipolar disorder Past esophageal reflux disease Hepatitis C  Past surgical history none  Social history denies any smoking or alcohol use    Review of Systems     Objective:   Physical Exam  Constitutional: She is oriented to person, place, and time. She appears well-developed and well-nourished. No distress.  Pulmonology lungs are clear to auscultation bilaterally no wheezes or crackles rhonchi Cardiovascular regular rate and rhythm no murmurs or gallops Neck: Normal range of motion. Neck supple.  Lymphadenopathy:  She has no cervical adenopathy.  Neurological: She is alert and oriented to person, place, and time.  Skin: Skin is warm and dry. 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

## 2013-02-26 NOTE — Progress Notes (Signed)
Patient here to establish care States has shingles across her collar bone

## 2013-02-26 NOTE — Patient Instructions (Signed)
Patient can go back and exercise

## 2013-02-27 LAB — COMPLETE METABOLIC PANEL WITH GFR
ALT: 158 U/L — ABNORMAL HIGH (ref 0–35)
Alkaline Phosphatase: 115 U/L (ref 39–117)
GFR, Est Non African American: 59 mL/min — ABNORMAL LOW
Sodium: 140 mEq/L (ref 135–145)
Total Bilirubin: 0.4 mg/dL (ref 0.3–1.2)
Total Protein: 7 g/dL (ref 6.0–8.3)

## 2013-02-27 LAB — HEMOGLOBIN A1C: Mean Plasma Glucose: 120 mg/dL — ABNORMAL HIGH (ref <117)

## 2013-04-01 ENCOUNTER — Ambulatory Visit: Payer: No Typology Code available for payment source

## 2014-02-03 ENCOUNTER — Encounter (HOSPITAL_COMMUNITY): Payer: Self-pay | Admitting: Emergency Medicine

## 2014-02-03 ENCOUNTER — Emergency Department (HOSPITAL_COMMUNITY)
Admission: EM | Admit: 2014-02-03 | Discharge: 2014-02-03 | Disposition: A | Discharge status: Home / Self Care | Payer: Self-pay | Attending: Emergency Medicine | Admitting: Emergency Medicine

## 2014-02-03 ENCOUNTER — Emergency Department (HOSPITAL_COMMUNITY): Payer: Self-pay

## 2014-02-03 DIAGNOSIS — F17200 Nicotine dependence, unspecified, uncomplicated: Secondary | ICD-10-CM | POA: Insufficient documentation

## 2014-02-03 DIAGNOSIS — I10 Essential (primary) hypertension: Secondary | ICD-10-CM | POA: Insufficient documentation

## 2014-02-03 DIAGNOSIS — R05 Cough: Secondary | ICD-10-CM | POA: Insufficient documentation

## 2014-02-03 DIAGNOSIS — R509 Fever, unspecified: Secondary | ICD-10-CM | POA: Insufficient documentation

## 2014-02-03 DIAGNOSIS — R079 Chest pain, unspecified: Secondary | ICD-10-CM

## 2014-02-03 DIAGNOSIS — R059 Cough, unspecified: Secondary | ICD-10-CM | POA: Insufficient documentation

## 2014-02-03 DIAGNOSIS — Z8619 Personal history of other infectious and parasitic diseases: Secondary | ICD-10-CM | POA: Insufficient documentation

## 2014-02-03 DIAGNOSIS — G43909 Migraine, unspecified, not intractable, without status migrainosus: Secondary | ICD-10-CM | POA: Insufficient documentation

## 2014-02-03 DIAGNOSIS — Z791 Long term (current) use of non-steroidal anti-inflammatories (NSAID): Secondary | ICD-10-CM | POA: Insufficient documentation

## 2014-02-03 DIAGNOSIS — R071 Chest pain on breathing: Secondary | ICD-10-CM | POA: Insufficient documentation

## 2014-02-03 HISTORY — DX: Unspecified viral hepatitis C without hepatic coma: B19.20

## 2014-02-03 HISTORY — DX: Migraine, unspecified, not intractable, without status migrainosus: G43.909

## 2014-02-03 LAB — BASIC METABOLIC PANEL
BUN: 12 mg/dL (ref 6–23)
CALCIUM: 9.1 mg/dL (ref 8.4–10.5)
CO2: 26 mEq/L (ref 19–32)
Chloride: 103 mEq/L (ref 96–112)
Creatinine, Ser: 0.74 mg/dL (ref 0.50–1.10)
GLUCOSE: 87 mg/dL (ref 70–99)
Potassium: 4.2 mEq/L (ref 3.7–5.3)
SODIUM: 142 meq/L (ref 137–147)

## 2014-02-03 LAB — CBC
HEMATOCRIT: 50.6 % — AB (ref 36.0–46.0)
HEMOGLOBIN: 17.8 g/dL — AB (ref 12.0–15.0)
MCH: 32.7 pg (ref 26.0–34.0)
MCHC: 35.2 g/dL (ref 30.0–36.0)
MCV: 92.8 fL (ref 78.0–100.0)
Platelets: 181 10*3/uL (ref 150–400)
RBC: 5.45 MIL/uL — ABNORMAL HIGH (ref 3.87–5.11)
RDW: 13.2 % (ref 11.5–15.5)
WBC: 7.6 10*3/uL (ref 4.0–10.5)

## 2014-02-03 LAB — I-STAT TROPONIN, ED
TROPONIN I, POC: 0.01 ng/mL (ref 0.00–0.08)
Troponin i, poc: 0 ng/mL (ref 0.00–0.08)

## 2014-02-03 MED ORDER — IOHEXOL 350 MG/ML SOLN
100.0000 mL | Freq: Once | INTRAVENOUS | Status: AC | PRN
  Administered 2014-02-03: 100 mL via INTRAVENOUS

## 2014-02-03 MED ORDER — HYDROCODONE-ACETAMINOPHEN 5-325 MG PO TABS: 1.0000 | ORAL_TABLET | Freq: Four times a day (QID) | ORAL | Status: AC | PRN

## 2014-02-03 NOTE — Discharge Instructions (Signed)
You have been diagnosed by your caregiver as having chest wall pain. SEEK IMMEDIATE MEDICAL ATTENTION IF: You develop a fever.  Your chest pains become severe or intolerable.  You develop new, unexplained symptoms (problems).  You develop shortness of breath, nausea, vomiting, sweating or feel light headed.  You develop a new cough or you cough up blood.    Chest Wall Pain Chest wall pain is pain in or around the bones and muscles of your chest. It may take up to 6 weeks to get better. It may take longer if you must stay physically active in your work and activities.  CAUSES  Chest wall pain may happen on its own. However, it may be caused by:  A viral illness like the flu.  Injury.  Coughing.  Exercise.  Arthritis.  Fibromyalgia.  Shingles. HOME CARE INSTRUCTIONS   Avoid overtiring physical activity. Try not to strain or perform activities that cause pain. This includes any activities using your chest or your abdominal and side muscles, especially if heavy weights are used.  Put ice on the sore area.  Put ice in a plastic bag.  Place a towel between your skin and the bag.  Leave the ice on for 15-20 minutes per hour while awake for the first 2 days.  Only take over-the-counter or prescription medicines for pain, discomfort, or fever as directed by your caregiver. SEEK IMMEDIATE MEDICAL CARE IF:   Your pain increases, or you are very uncomfortable.  You have a fever.  Your chest pain becomes worse.  You have new, unexplained symptoms.  You have nausea or vomiting.  You feel sweaty or lightheaded.  You have a cough with phlegm (sputum), or you cough up blood. MAKE SURE YOU:   Understand these instructions.  Will watch your condition.  Will get help right away if you are not doing well or get worse. Document Released: 09/19/2005 Document Revised: 12/12/2011 Document Reviewed: 05/16/2011 [facility] Patient Information 2014 [place], [place]. Acetaminophen;  Hydrocodone tablets or capsules What is this medicine? ACETAMINOPHEN; HYDROCODONE (a set a MEE noe fen; hye droe KOE done) is a pain reliever. It is used to treat mild to moderate pain. This medicine may be used for other purposes; ask your health care provider or pharmacist if you have questions. COMMON BRAND NAME(S): Anexsia, Bancap HC , Ceta-Plus, Co-Gesic, Comfortpak , Dolagesic, Du PontDolorex Forte, [address]/Fiscal ServicesDuoCet , [address] , Hydrogesic, Lorcet HD, Lorcet Plus, Lorcet, [place]ortab, Margesic H, Maxidone, [place]co, Polygesic, [place]ic, [place]acet, Vicodin ES, Vicodin HP, Vicodin, [place] BasemanXodol, Zydone What should I tell my health care provider before I take this medicine? They need to know if you have any of these conditions: -brain tumor -Crohn's disease, inflammatory bowel disease, or ulcerative colitis -drug abuse or addiction -head injury -heart or circulation problems -if you often drink alcohol -kidney disease or problems going to the bathroom -liver disease -lung disease, asthma, or breathing problems -an unusual or allergic reaction to acetaminophen, hydrocodone, other opioid analgesics, other medicines, foods, dyes, or preservatives -pregnant or trying to get pregnant -breast-feeding How should I use this medicine? Take this medicine by mouth. Swallow it with a full glass of water. Follow the directions on the prescription label. If the medicine upsets your stomach, take the medicine with food or milk. Do not take more than you are told to take. Talk to your pediatrician regarding the use of this medicine in children. This medicine is not approved for use in children. Overdosage: If you think you have taken too much of  this medicine contact a poison control center or emergency room at once. NOTE: This medicine is only for you. Do not share this medicine with others. What if I miss a dose? If you miss a dose, take it as soon as you can. If it is almost time for your next dose, take only that dose. Do not take  double or extra doses. What may interact with this medicine? -alcohol -antihistamines -isoniazid -medicines for depression, anxiety, or psychotic disturbances -medicines for sleep -muscle relaxants -naltrexone -narcotic medicines (opiates) for pain -phenobarbital -ritonavir -tramadol This list may not describe all possible interactions. Give your health care provider a list of all the medicines, herbs, non-prescription drugs, or dietary supplements you use. Also tell them if you smoke, drink alcohol, or use illegal drugs. Some items may interact with your medicine. What should I watch for while using this medicine? Tell your doctor or health care professional if your pain does not go away, if it gets worse, or if you have new or a different type of pain. You may develop tolerance to the medicine. Tolerance means that you will need a higher dose of the medicine for pain relief. Tolerance is normal and is expected if you take the medicine for a long time. Do not suddenly stop taking your medicine because you may develop a severe reaction. Your body becomes used to the medicine. This does NOT mean you are addicted. Addiction is a behavior related to getting and using a drug for a non-medical reason. If you have pain, you have a medical reason to take pain medicine. Your doctor will tell you how much medicine to take. If your doctor wants you to stop the medicine, the dose will be slowly lowered over time to avoid any side effects. You may get drowsy or dizzy when you first start taking the medicine or change doses. Do not drive, use machinery, or do anything that may be dangerous until you know how the medicine affects you. Stand or sit up slowly. There are different types of narcotic medicines (opiates) for pain. If you take more than one type at the same time, you may have more side effects. Give your health care provider a list of all medicines you use. Your doctor will tell you how much medicine  to take. Do not take more medicine than directed. Call emergency for help if you have problems breathing. The medicine will cause constipation. Try to have a bowel movement at least every 2 to 3 days. If you do not have a bowel movement for 3 days, call your doctor or health care professional. Too much acetaminophen can be very dangerous. Do not take Tylenol (acetaminophen) or medicines that contain acetaminophen with this medicine. Many non-prescription medicines contain acetaminophen. Always read the labels carefully. What side effects may I notice from receiving this medicine? Side effects that you should report to your doctor or health care professional as soon as possible: -allergic reactions like skin rash, itching or hives, swelling of the face, lips, or tongue -breathing problems -confusion -feeling faint or lightheaded, falls -stomach pain -yellowing of the eyes or skin Side effects that usually do not require medical attention (report to your doctor or health care professional if they continue or are bothersome): -nausea, vomiting -stomach upset This list may not describe all possible side effects. Call your doctor for medical advice about side effects. You may report side effects to FDA at 1-800-FDA-1088. Where should I keep my medicine? Keep out of the reach  of children. This medicine can be abused. Keep your medicine in a safe place to protect it from theft. Do not share this medicine with anyone. Selling or giving away this medicine is dangerous and against the law. Store at room temperature between 15 and 30 degrees C (59 and 86 degrees F). Protect from light. Keep container tightly closed.  Throw away any unused medicine after the expiration date. Discard unused medicine and used packaging carefully. Pets and children can be harmed if they find used or lost packages. NOTE: This sheet is a summary. It may not cover all possible information. If you have questions about this medicine,  talk to your doctor, pharmacist, or health care provider.  2014, Elsevier/Gold Standard. (2013-05-13 13:15:56)

## 2014-02-03 NOTE — ED Notes (Addendum)
Pt states that she began to have productive cough with phlegm on Friday. Pt began to have chest pain and sob at that time. Since then pt states chest pain has gotten worse as cough has gotten better. Pt states she feels sob and has thrown up. Able to keep down fluids. PT states her migraine has also started up again. Pt speaking in complete sentences.

## 2014-02-03 NOTE — ED Notes (Signed)
Patient transported to X-ray 

## 2014-02-03 NOTE — ED Notes (Signed)
PA student at bedside.

## 2014-02-03 NOTE — ED Provider Notes (Signed)
CSN: [id number]     Arrival date & time 02/03/14  1848 History   First MD Initiated Contact with Patient 02/03/14 1915     Chief Complaint  Patient presents with  . Cough  . Chest Pain     (Consider location/radiation/quality/duration/timing/severity/associated sxs/prior Treatment) HPI Sheri Burnett is a(n) 58 y.o. female who presents to the ED w cc CHest pain. She has a hx of HTN, Migraine, and HepC. She is a daily smoker.  Comes in complaining of 4 days of chest pain and cough. Patient states that she is coughing so severe on Friday that she had urinary incontinence associated with it. She complains of subjective fever, she has continued severe anterior pleuritic chest pain. She rates it an 8/10. She denies any risk factors for PE including unilateral leg swelling, endogenous estrogens, recent surgery or confinement. She denies a history of known coronary artery disease she is has a history of hypertension. Past Medical History  Diagnosis Date  . Hypertension   . Migraine   . Hepatitis C    History reviewed. No pertinent past surgical history. History reviewed. No pertinent family history. History  Substance Use Topics  . Smoking status: Current Every Day Smoker  . Smokeless tobacco: Not on file  . Alcohol Use: No   OB History   Grav Para Term Preterm Abortions TAB SAB Ect Mult Living                 Review of Systems  Ten systems reviewed and are negative for acute change, except as noted in the HPI.    Allergies  Review of patient's allergies indicates no known allergies.  Home Medications   Prior to Admission medications   Medication Sig Start Date End Date Taking? Authorizing Provider  diclofenac (CATAFLAM) 50 MG tablet Take 1 tablet (50 mg total) by mouth 3 (three) times daily. Prn pain 02/15/13   Linna HoffJames D Kindl, MD  esomeprazole (NEXIUM) 40 MG capsule Take 1 capsule (40 mg total) by mouth daily. 02/26/13   Richarda OverlieNayana Abrol, MD  gabapentin (NEURONTIN) 100 MG capsule  Take 2 capsules (200 mg total) by mouth 3 (three) times daily. 02/26/13   Richarda OverlieNayana Abrol, MD  lisinopril (PRINIVIL,ZESTRIL) 10 MG tablet Take 1 tablet (10 mg total) by mouth daily. 02/26/13   Richarda OverlieNayana Abrol, MD  LISINOPRIL PO Take by mouth.    Historical Provider, MD  lurasidone (LATUDA) 40 MG TABS Take by mouth daily with breakfast.    Historical Provider, MD  Lurasidone HCl (LATUDA PO) Take by mouth.    Historical Provider, MD  valACYclovir (VALTREX) 1000 MG tablet Take 1 tablet (1,000 mg total) by mouth 3 (three) times daily. 02/15/13   Linna HoffJames D Kindl, MD  Venlafaxine HCl (EFFEXOR PO) Take 225 mg by mouth.     Historical Provider, MD   BP 126/68  Pulse 89  Temp(Src) 98 F (36.7 C) (Oral)  Resp 18  SpO2 95% Physical Exam Physical Exam  Nursing note and vitals reviewed. Constitutional: She is oriented to person, place, and time. She appears well-developed and well-nourished. No distress.  HENT:  Head: Normocephalic and atraumatic.  Eyes: Conjunctivae normal and EOM are normal. Pupils are equal, round, and reactive to light. No scleral icterus.  Neck: Normal range of motion.  Cardiovascular: Normal rate, regular rhythm and normal heart sounds.  Exam reveals no gallop and no friction rub.   No murmur heard. Pulmonary/Chest: Effort normal and breath sounds normal. No respiratory distress.  Abdominal: Soft.  Bowel sounds are normal. She exhibits no distension and no mass. There is no tenderness. There is no guarding.  Neurological: She is alert and oriented to person, place, and time.  Skin: Skin is warm and dry. She is not diaphoretic.    ED Course  Procedures (including critical care time) Labs Review Labs Reviewed  CBC  BASIC METABOLIC PANEL  I-STAT TROPOININ, ED    Imaging Review Dg Chest 2 View (if Patient Has Fever And/or Copd)  02/03/2014   CLINICAL DATA:  Chest pain and cough.  EXAM: CHEST  2 VIEW  COMPARISON:  01/06/2007  FINDINGS: Emphysema noted. Cardiac and mediastinal margins  appear normal. 7 mm density projects over the right lateral lung base, along the anterior portion of the right sixth rib.  The lungs appear otherwise clear. No pleural effusion. Mildly accentuated pulmonary markings are likely due to the underlying emphysema.  IMPRESSION: 1. Possible 7 mm pulmonary nodule of the right lateral lung base. Given the patient's emphysema, CT scan of the chest is recommended for further workup.   Electronically Signed   By: Herbie BaltimoreWalt  Liebkemann M.D.   On: 02/03/2014 19:53     EKG Interpretation None      MDM   Final diagnoses:  Chest pain   Patient with peluritic cp. Not reproducible with palpation. She is wells low risk but not perc negative. CXR with 7mm pulmonary nodule. I will obtain a CTA chest to r/u PE and better evaluate the nocdule.   10:30 PM Negative CT angio. Will get 2nd troponin.   Patient is to be discharged with recommendation to follow up with PCP in regards to today's hospital visit. Chest pain is not likely of cardiac or pulmonary etiology d/t presentation, CTA negative, VSS, no tracheal deviation, no JVD or new murmur, RRR, breath sounds equal bilaterally, EKG without acute abnormalities, negative troponin x2, . Feel this is chest wall pain. Return to ED if CP becomes exertional, associated with diaphoresis or nausea, radiates to left jaw/arm, worsens or becomes concerning in any way. Pt appears reliable for follow up and is agreeable to discharge.   Case has been discussed with and seen by Dr. Wilkie AyeHorton who agrees with the above plan to discharge.     Arthor CaptainAbigail Marsia Cino, PA-C 02/03/14 2310

## 2014-02-05 NOTE — ED Provider Notes (Signed)
Medical screening examination/treatment/procedure(s) were performed by non-physician practitioner and as supervising physician I was immediately available for consultation/collaboration.   EKG Interpretation None       Trejan Buda F Camila Maita, MD 02/05/14 0112 

## 2016-04-12 IMAGING — CT CT ANGIO CHEST
1 of 2 series · 19 of 32 positions shown · IV contrast (OMNIPAQUE 350)
Comparison: Chest radiographs dated 02/03/2014. CTA chest dated
01/06/2007.

CLINICAL DATA: Cough, chest pain, shortness of breath

EXAM:
CT ANGIOGRAPHY CHEST WITH CONTRAST
TECHNIQUE: Multidetector CT imaging of the chest was performed using the
standard protocol during bolus administration of intravenous
contrast. Multiplanar CT image reconstructions and MIPs were
obtained to evaluate the vascular anatomy.
CONTRAST:  100mL OMNIPAQUE IOHEXOL 350 MG/ML SOLN

[Series 11: thins for pacs · axial · 0.74mm/px · z∈[+1986,+2190]mm · 19 of 228 slices shown]
[im 12/228  lung]
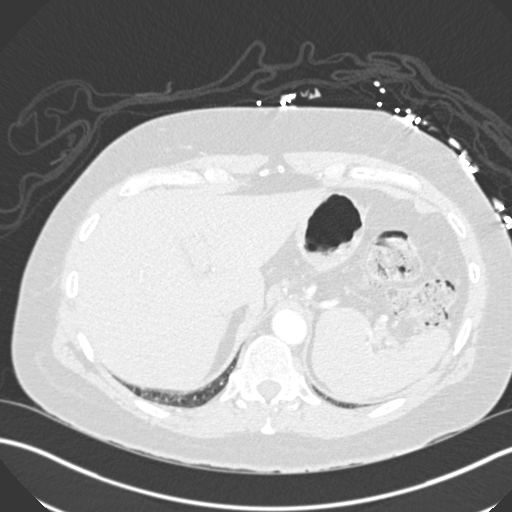
[im 23/228  mediastinal]
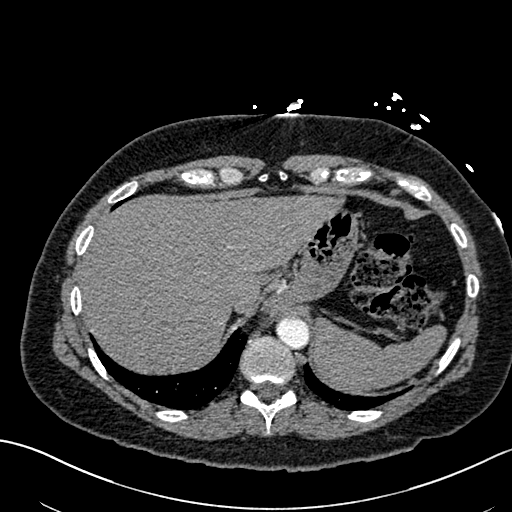
[im 35/228  lung]
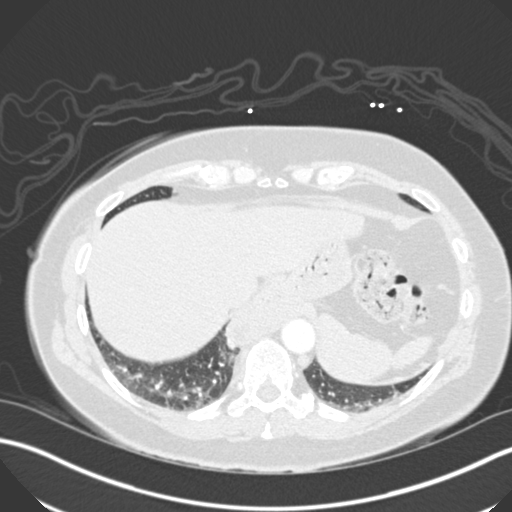
[im 57/228  mediastinal]
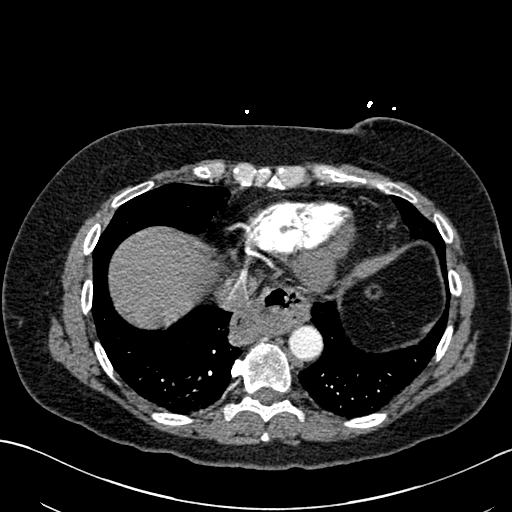
[im 69/228  lung]
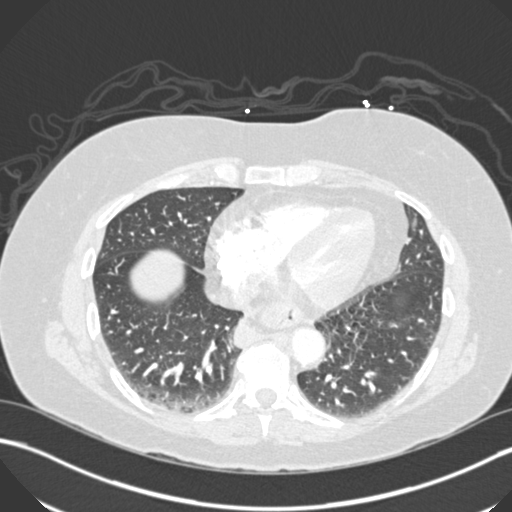
[im 76/228  mediastinal]
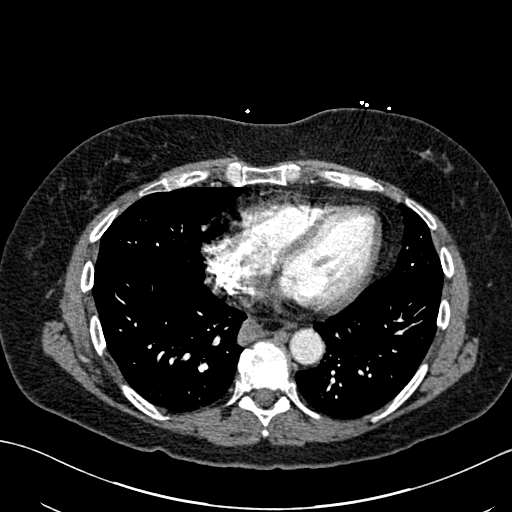
[im 80/228  lung]
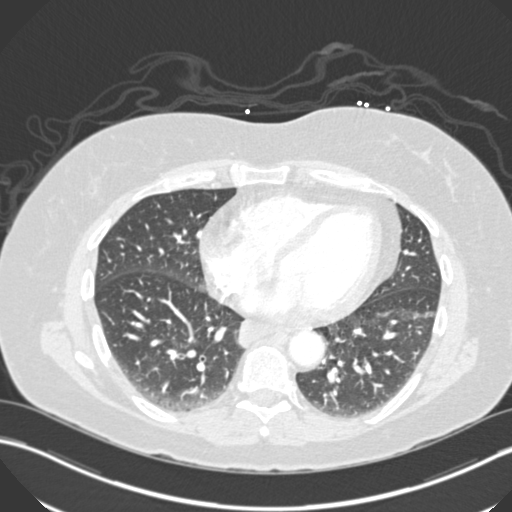
[im 91/228  mediastinal]
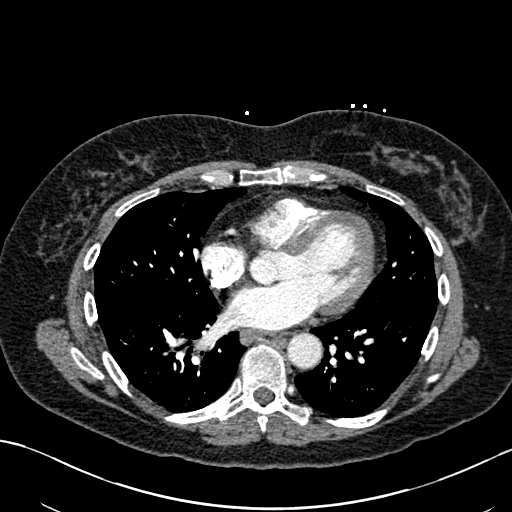
[im 103/228  lung]
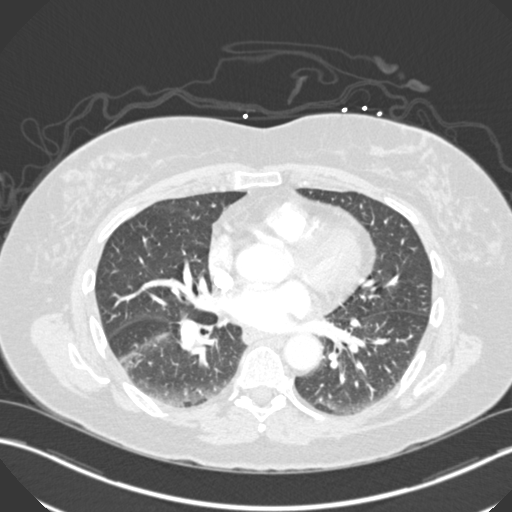
[im 114/228  mediastinal]
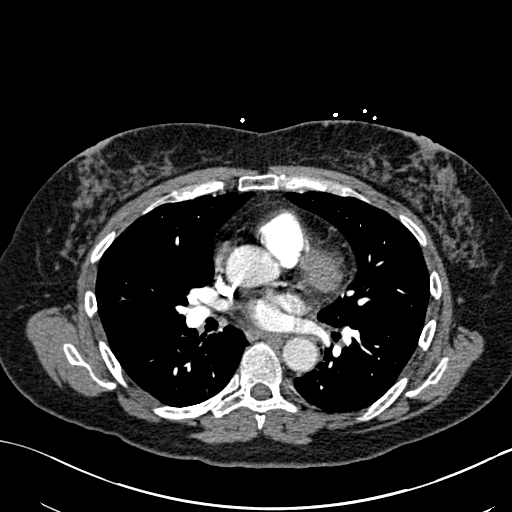
[im 125/228  lung]
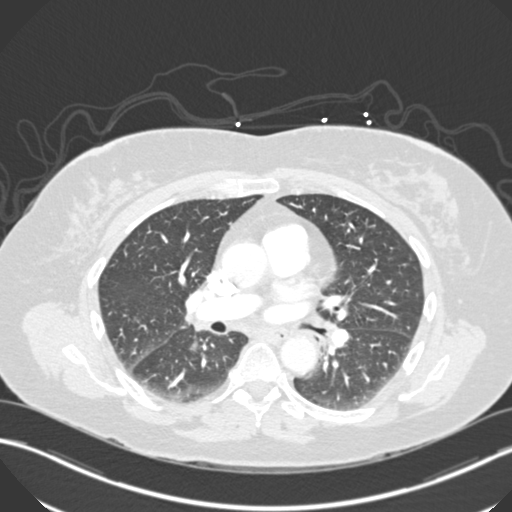
[im 137/228  mediastinal]
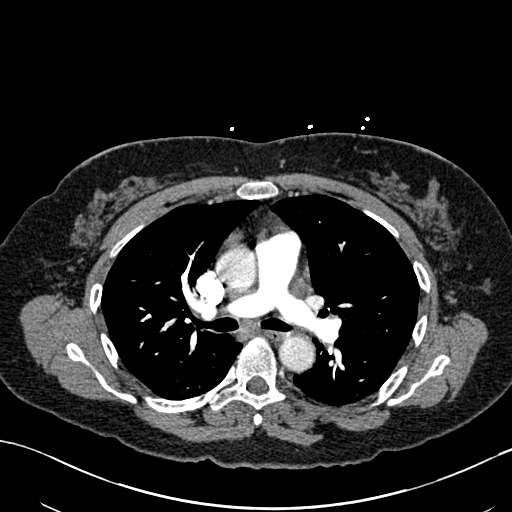
[im 148/228  lung]
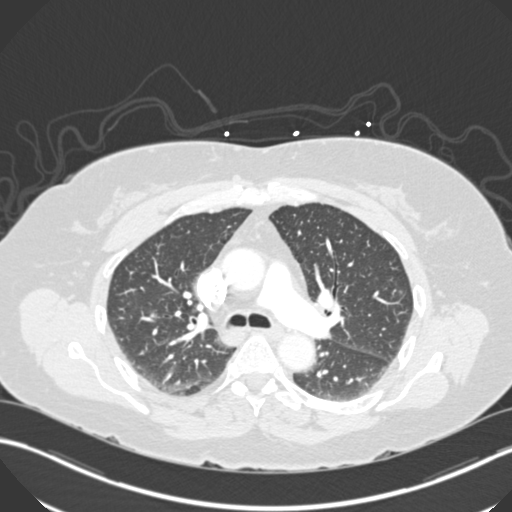
[im 152/228  mediastinal]
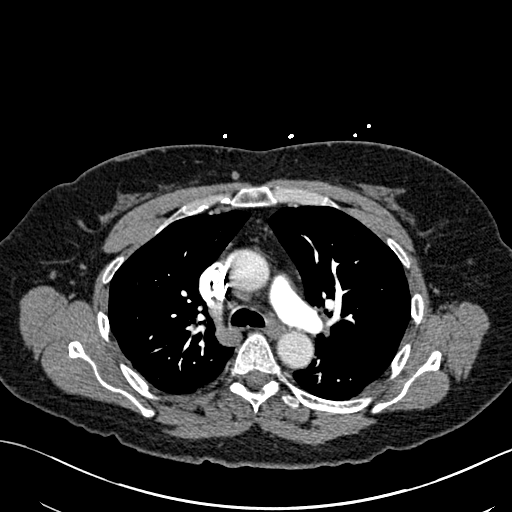
[im 159/228  lung]
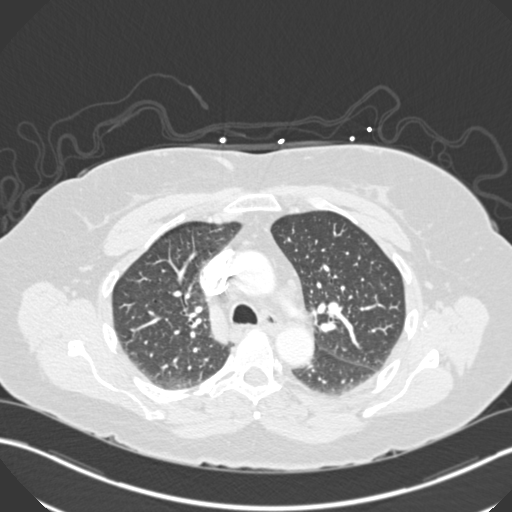
[im 171/228  mediastinal]
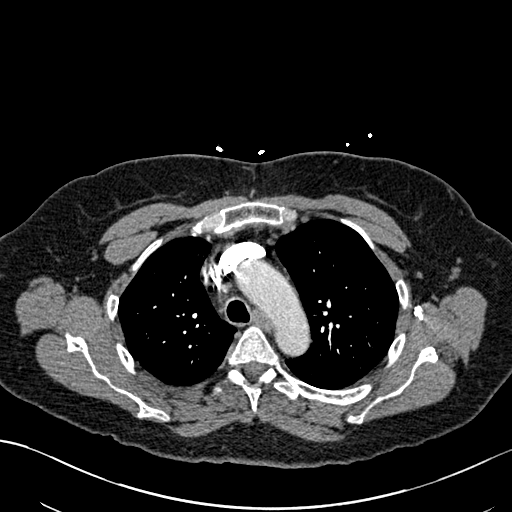
[im 193/228  lung]
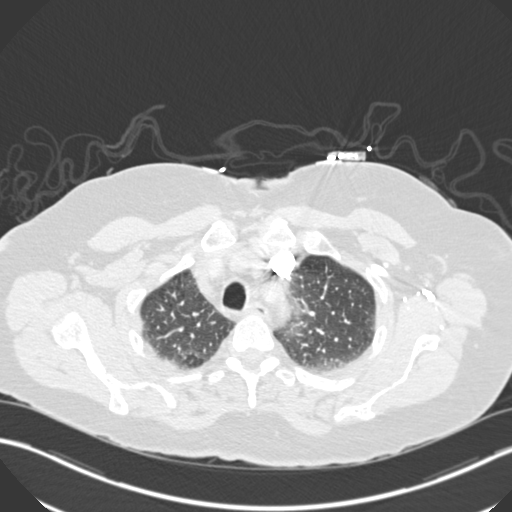
[im 205/228  mediastinal]
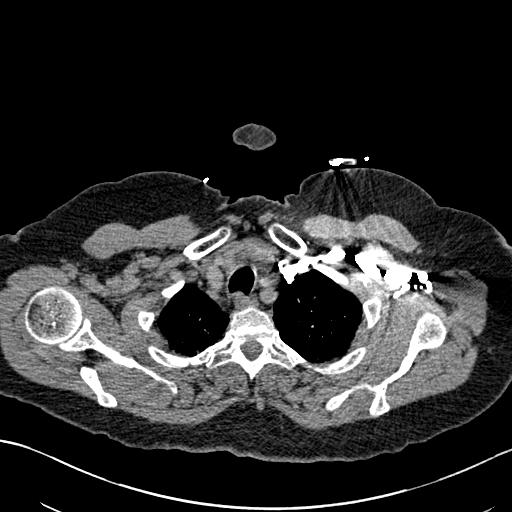
[im 216/228  lung]
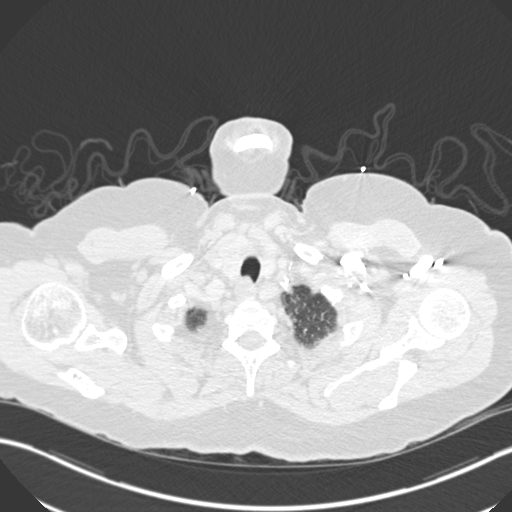

[19 of 32 positions shown; findings below may reference images not displayed]

FINDINGS: No evidence of pulmonary embolism.

Mild dependent atelectasis in the bilateral lower lobes. Mild
paraseptal emphysematous changes. No suspicious pulmonary nodules.
Specifically, no right lower lobe nodule to correspond to the
suspected radiographic abnormality. No pleural effusion or
pneumothorax.

Visualized thyroid is mildly heterogeneous/nodular.

The heart is normal in size.  No pericardial effusion.

Small mediastinal lymph nodes which do not meet pathologic CT size
criteria. No suspicious hilar or axillary lymphadenopathy.

Visualized upper abdomen is notable for a moderate hiatal hernia.

Mild degenerative changes of the visualized thoracolumbar spine.

Review of the MIP images confirms the above findings.
IMPRESSION: No evidence of pulmonary embolism.

No suspicious pulmonary nodules. Specifically, no right lower lobe
nodule to correspond to the suspected radiographic abnormality.

No evidence of acute cardiopulmonary disease.

## 2016-04-12 IMAGING — CR DG CHEST 2V
2 series · 2 of 2 positions shown · non-contrast
Comparison: 01/06/2007

CLINICAL DATA: Chest pain and cough.

EXAM:
CHEST  2 VIEW

[w chest pa]
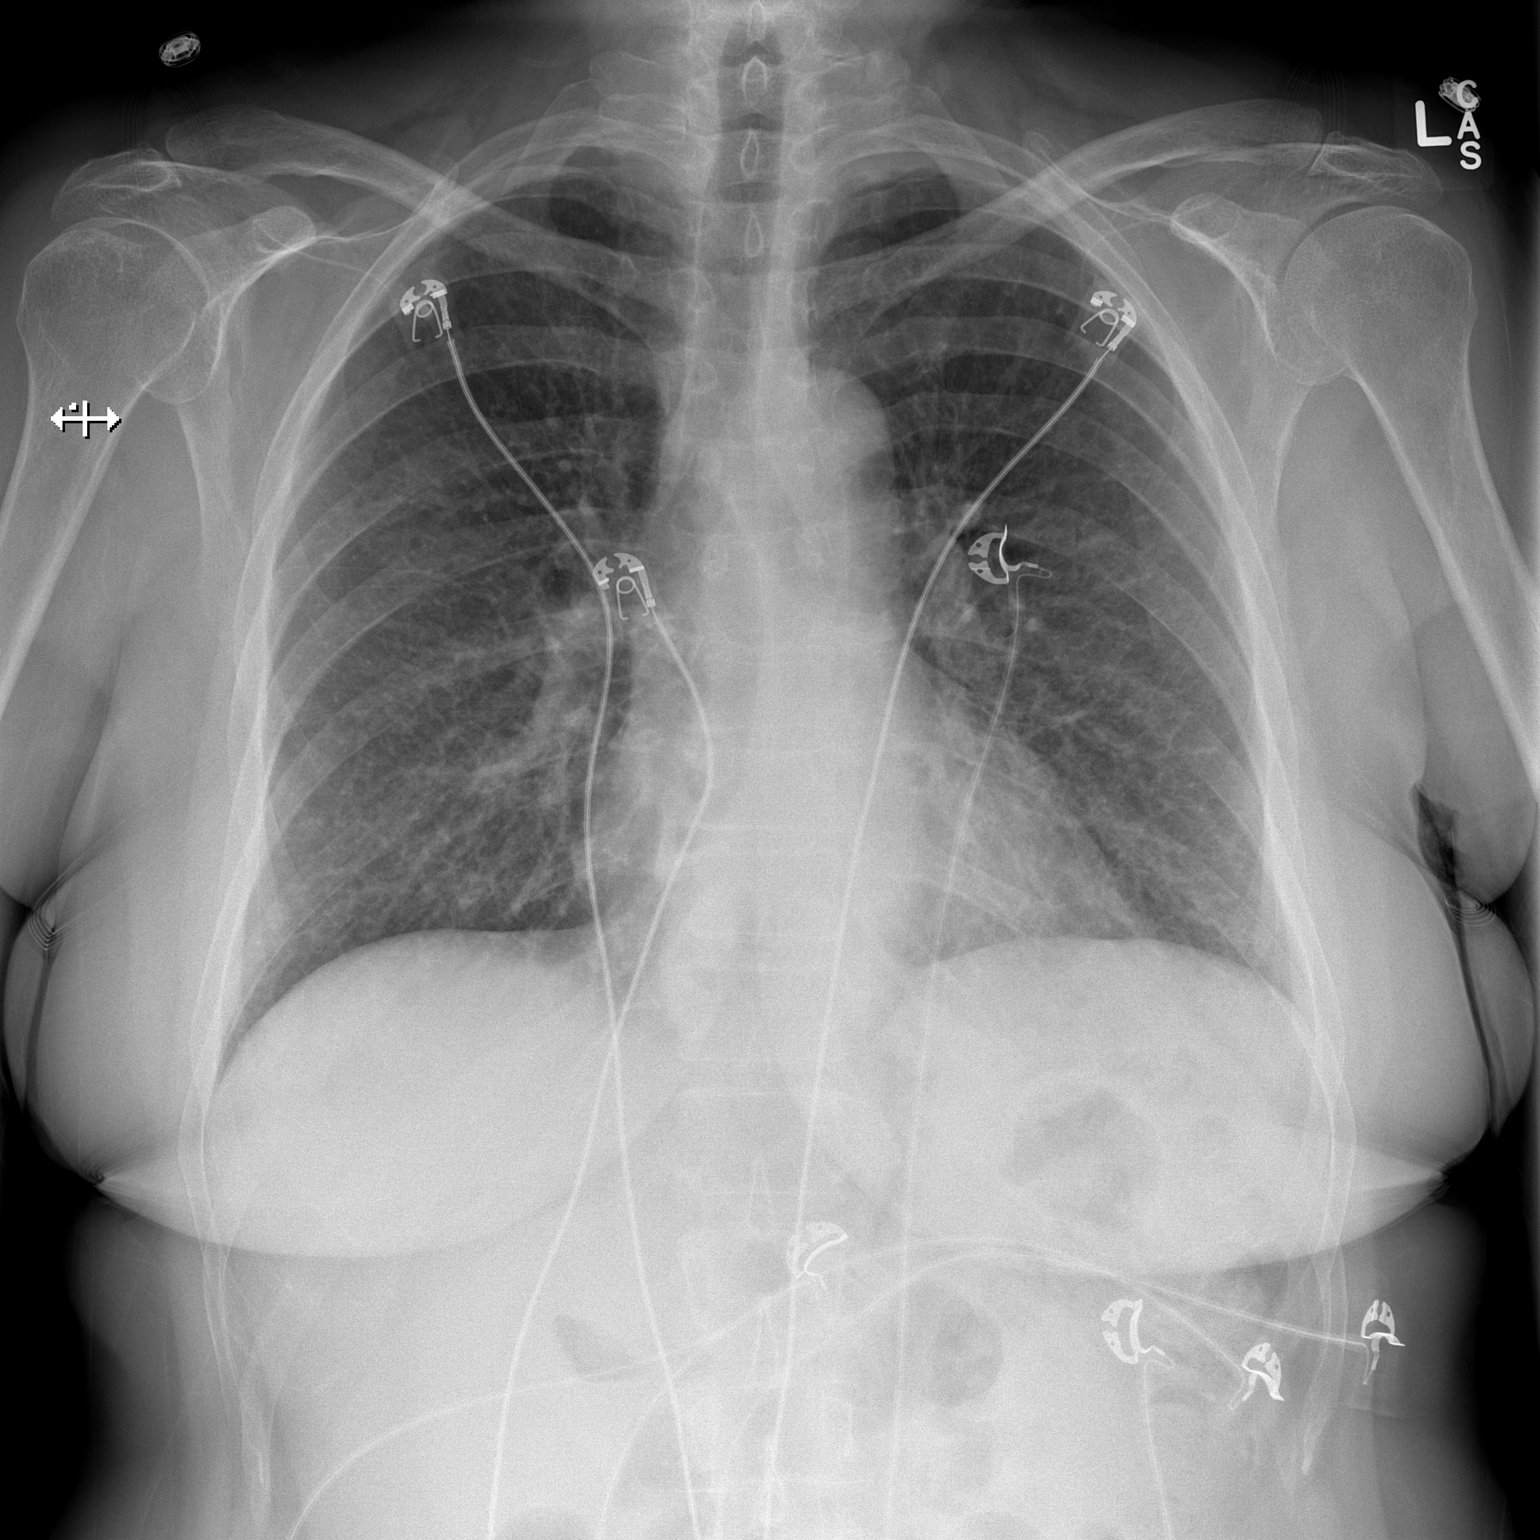

[w chest lat]
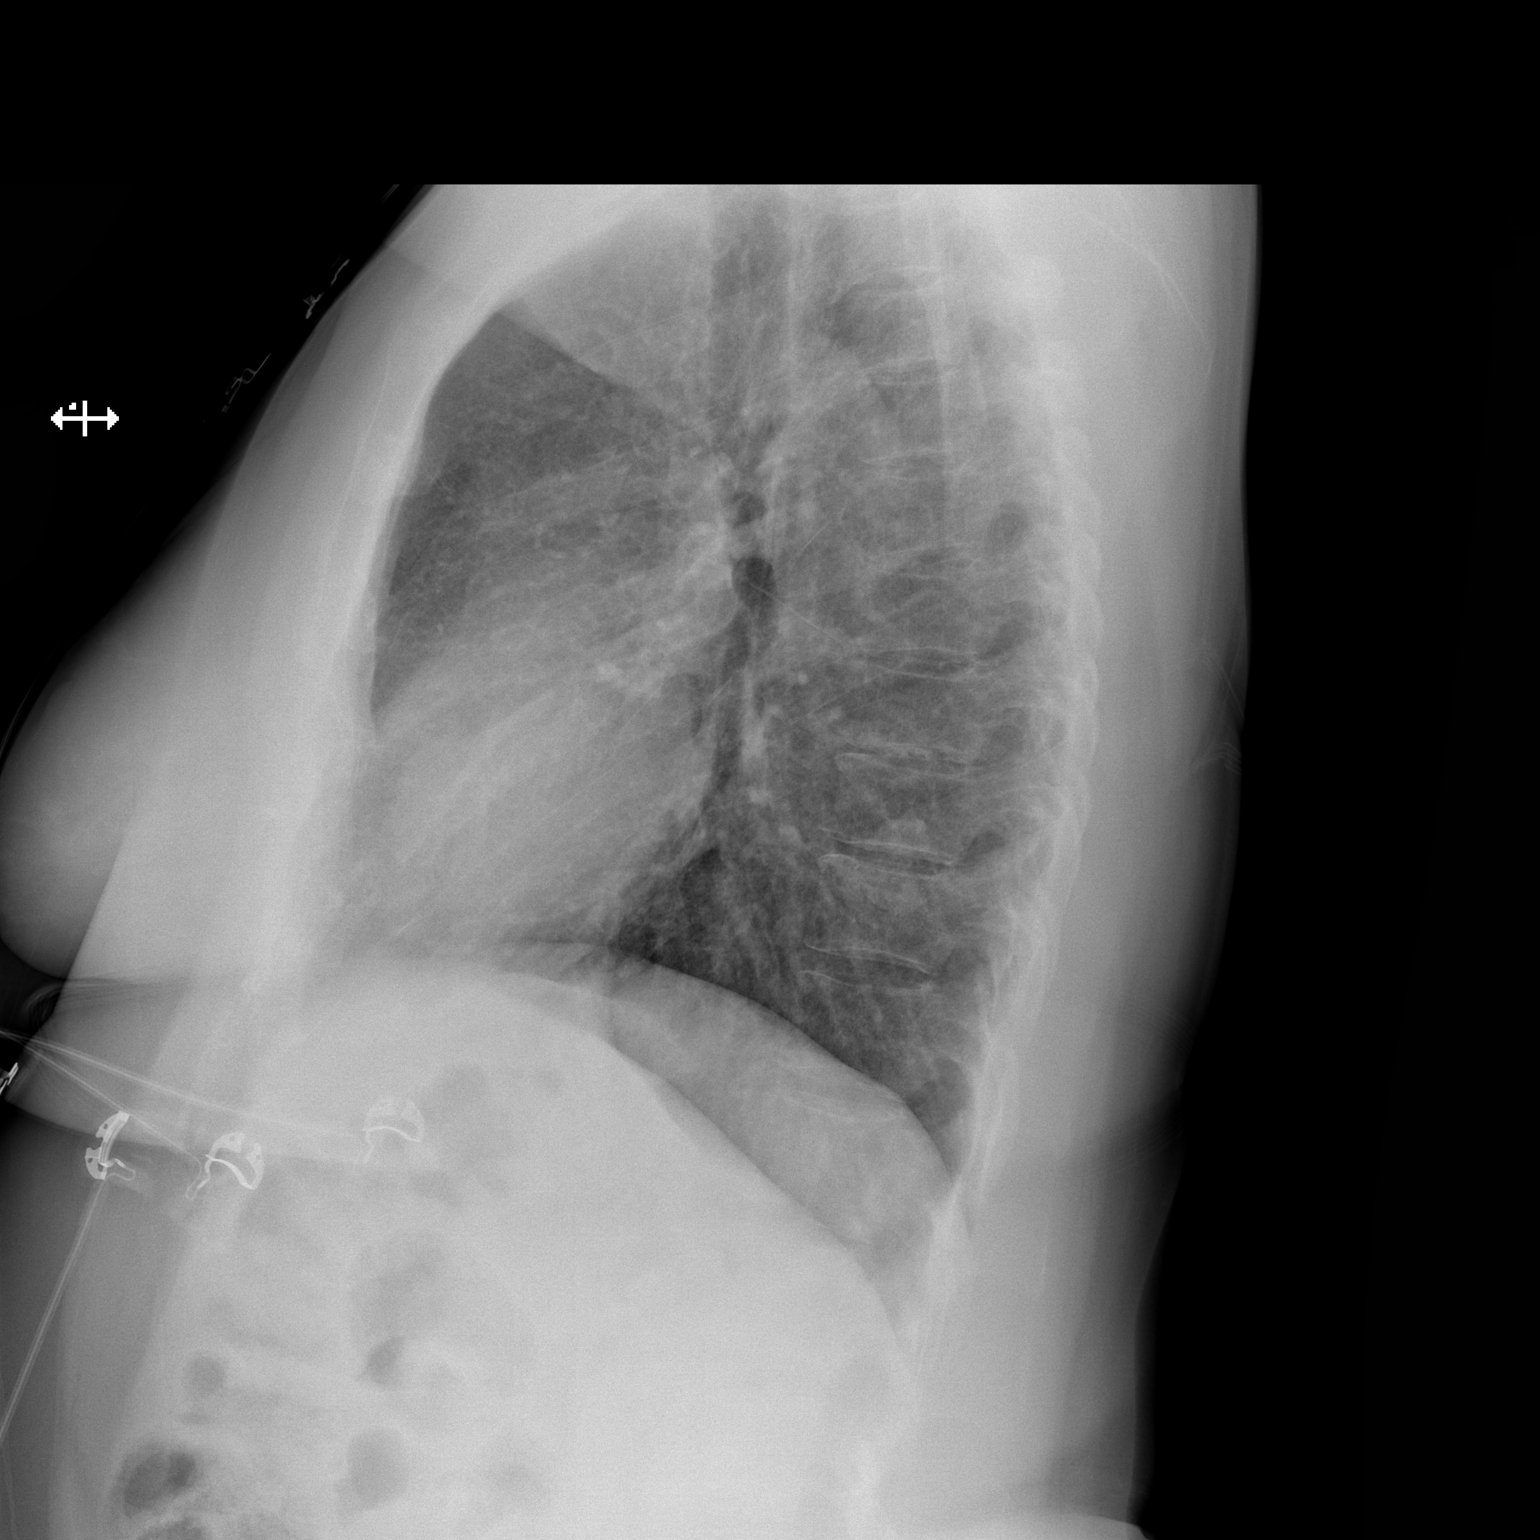

[2 of 2 positions shown; findings below may reference images not displayed]

FINDINGS: Emphysema noted. Cardiac and mediastinal margins appear normal. 7 mm
density projects over the right lateral lung base, along the
anterior portion of the right sixth rib.

The lungs appear otherwise clear. No pleural effusion. Mildly
accentuated pulmonary markings are likely due to the underlying
emphysema.
IMPRESSION: 1. Possible 7 mm pulmonary nodule of the right lateral lung base.
Given the patient's emphysema, CT scan of the chest is recommended
for further workup.
